# Patient Record
Sex: Female | Born: 1995 | Race: White | Hispanic: No | Marital: Single | Smoking: Never smoker
Health system: Southern US, Community
[De-identification: ages and names within clinical notes are randomized; demographics above are authoritative.]

## PROBLEM LIST (undated history)

## (undated) DIAGNOSIS — G43909 Migraine, unspecified, not intractable, without status migrainosus: Secondary | ICD-10-CM

---
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
## 2016-01-16 ENCOUNTER — Emergency Department: Payer: PRIVATE HEALTH INSURANCE

## 2016-01-16 ENCOUNTER — Emergency Department
Admission: EM | Admit: 2016-01-16 | Discharge: 2016-01-16 | Disposition: A | Discharge status: Home / Self Care | Payer: PRIVATE HEALTH INSURANCE | Attending: Emergency Medicine | Admitting: Emergency Medicine

## 2016-01-16 ENCOUNTER — Encounter: Payer: Self-pay | Admitting: Emergency Medicine

## 2016-01-16 DIAGNOSIS — Z3202 Encounter for pregnancy test, result negative: Secondary | ICD-10-CM | POA: Diagnosis not present

## 2016-01-16 DIAGNOSIS — R109 Unspecified abdominal pain: Secondary | ICD-10-CM

## 2016-01-16 DIAGNOSIS — R319 Hematuria, unspecified: Secondary | ICD-10-CM

## 2016-01-16 DIAGNOSIS — N390 Urinary tract infection, site not specified: Secondary | ICD-10-CM | POA: Insufficient documentation

## 2016-01-16 DIAGNOSIS — R112 Nausea with vomiting, unspecified: Secondary | ICD-10-CM | POA: Diagnosis not present

## 2016-01-16 DIAGNOSIS — R1031 Right lower quadrant pain: Secondary | ICD-10-CM | POA: Diagnosis present

## 2016-01-16 HISTORY — DX: Migraine, unspecified, not intractable, without status migrainosus: G43.909

## 2016-01-16 LAB — COMPREHENSIVE METABOLIC PANEL
ALBUMIN: 4.7 g/dL (ref 3.5–5.0)
ALK PHOS: 47 U/L (ref 38–126)
ALT: 14 U/L (ref 14–54)
ANION GAP: 11 (ref 5–15)
AST: 29 U/L (ref 15–41)
BILIRUBIN TOTAL: 0.8 mg/dL (ref 0.3–1.2)
BUN: 17 mg/dL (ref 6–20)
CALCIUM: 9.5 mg/dL (ref 8.9–10.3)
CO2: 23 mmol/L (ref 22–32)
Chloride: 107 mmol/L (ref 101–111)
Creatinine, Ser: 0.76 mg/dL (ref 0.44–1.00)
GFR calc Af Amer: >60 mL/min (ref >60)
GLUCOSE: 130 mg/dL — AB (ref 65–99)
Potassium: 4.1 mmol/L (ref 3.5–5.1)
Sodium: 141 mmol/L (ref 135–145)
TOTAL PROTEIN: 7.5 g/dL (ref 6.5–8.1)

## 2016-01-16 LAB — CBC
HCT: 43.9 % (ref 35.0–47.0)
Hemoglobin: 15.2 g/dL (ref 12.0–16.0)
MCH: 31.5 pg (ref 26.0–34.0)
MCHC: 34.7 g/dL (ref 32.0–36.0)
MCV: 90.7 fL (ref 80.0–100.0)
Platelets: 143 10*3/uL — ABNORMAL LOW (ref 150–440)
RBC: 4.84 MIL/uL (ref 3.80–5.20)
RDW: 13.5 % (ref 11.5–14.5)
WBC: 8 10*3/uL (ref 3.6–11.0)

## 2016-01-16 LAB — URINALYSIS COMPLETE WITH MICROSCOPIC (ARMC ONLY)
BILIRUBIN URINE: NEGATIVE
GLUCOSE, UA: NEGATIVE mg/dL
HGB URINE DIPSTICK: NEGATIVE
NITRITE: NEGATIVE
Protein, ur: NEGATIVE mg/dL
SPECIFIC GRAVITY, URINE: 1.023 (ref 1.005–1.030)
pH: 7 (ref 5.0–8.0)

## 2016-01-16 LAB — LIPASE, BLOOD: Lipase: 23 U/L (ref 11–51)

## 2016-01-16 LAB — PREGNANCY, URINE: Preg Test, Ur: NEGATIVE

## 2016-01-16 MED ORDER — IOHEXOL 300 MG/ML  SOLN
75.0000 mL | Freq: Once | INTRAMUSCULAR | Status: AC | PRN
  Administered 2016-01-16: 75 mL via INTRAVENOUS

## 2016-01-16 MED ORDER — IOHEXOL 240 MG/ML SOLN
25.0000 mL | Freq: Once | INTRAMUSCULAR | Status: AC | PRN
  Administered 2016-01-16: 25 mL via ORAL

## 2016-01-16 MED ORDER — ONDANSETRON 4 MG PO TBDP: 4.0000 mg | ORAL_TABLET | Freq: Three times a day (TID) | ORAL | Status: AC | PRN

## 2016-01-16 MED ORDER — SULFAMETHOXAZOLE-TRIMETHOPRIM 800-160 MG PO TABS: 1.0000 | ORAL_TABLET | Freq: Two times a day (BID) | ORAL | Status: AC

## 2016-01-16 MED ORDER — ONDANSETRON HCL 4 MG/2ML IJ SOLN
4.0000 mg | Freq: Once | INTRAMUSCULAR | Status: AC | PRN
  Administered 2016-01-16: 4 mg via INTRAVENOUS
  Filled 2016-01-16: qty 2

## 2016-01-16 MED ORDER — SODIUM CHLORIDE 0.9 % IV BOLUS (SEPSIS)
1000.0000 mL | Freq: Once | INTRAVENOUS | Status: AC
Start: 2016-01-16 — End: 2016-01-16
  Administered 2016-01-16: 1000 mL via INTRAVENOUS

## 2016-01-16 NOTE — ED Notes (Signed)
Report from Ann, RN

## 2016-01-16 NOTE — ED Provider Notes (Signed)
[facility] Emergency Department Provider Note  ____________________________________________  Time seen: Approximately 8:48 AM  I have reviewed the triage vital signs and the nursing notes.   HISTORY  Chief Complaint Abdominal Pain and Emesis    HPI Renee Velazquez is a 20 y.o. female who complains of nausea and vomiting starting yesterday. Patient also had some mild abdominal pain especially in the right lower quadrant. She has not had any diarrhea she denies any fever. Pain seemed to get worse if she ate. Not really made worse by movement. She last vomited several hours ago. Pain is achy in quality  Past Medical History  Diagnosis Date  . Migraines     There are no active problems to display for this patient.   History reviewed. No pertinent past surgical history.  Current Outpatient Rx  Name  Route  Sig  Dispense  Refill  . ibuprofen (ADVIL,MOTRIN) 200 MG tablet   Oral   Take 400 mg by mouth every 4 (four) hours as needed for mild pain or moderate pain.         . SUMAtriptan (IMITREX) 50 MG tablet   Oral   Take 50 mg by mouth every 2 (two) hours as needed for migraine. May repeat in 2 hours if headache persists or recurs.         . ondansetron (ZOFRAN ODT) 4 MG disintegrating tablet   Oral   Take 1 tablet (4 mg total) by mouth every 8 (eight) hours as needed for nausea or vomiting.   20 tablet   0   . sulfamethoxazole-trimethoprim (BACTRIM DS,SEPTRA DS) 800-160 MG tablet   Oral   Take 1 tablet by mouth 2 (two) times daily.   14 tablet   0     Allergies Review of patient's allergies indicates no known allergies.  History reviewed. No pertinent family history.  Social History Social History  Substance Use Topics  . Smoking status: Never Smoker   . Smokeless tobacco: None  . Alcohol Use: Yes     Comment: occasional    Review of Systems Constitutional: No fever/chills Eyes: No visual changes. ENT: No sore  throat. Cardiovascular: Denies chest pain. Respiratory: Denies shortness of breath. Gastrointestinal: No abdominal pain.  No nausea, no vomiting.  No diarrhea.  No constipation. Genitourinary: Negative for dysuria. Musculoskeletal: Negative for back pain. Skin: Negative for rash. Neurological: Negative for headaches, focal weakness or numbness.   10-point ROS otherwise negative.  ____________________________________________   PHYSICAL EXAM:  VITAL SIGNS: ED Triage Vitals  Enc Vitals Group     BP 01/16/16 0633 117/68 mmHg     Pulse Rate 01/16/16 0633 103     Resp 01/16/16 0633 18     Temp 01/16/16 0633 98.1 F (36.7 C)     Temp Source 01/16/16 0633 Oral     SpO2 01/16/16 0633 100 %     Weight 01/16/16 0633 147 lb 11.3 oz (67 kg)     Height 01/16/16 0633 5\' 8"  (1.727 m)     Head Cir --      Peak Flow --      Pain Score 01/16/16 0630 3     Pain Loc --      Pain Edu? --      Excl. in GC? --     Constitutional: Alert and oriented. Well appearing and in no acute distress. Eyes: Conjunctivae are normal. PERRL. EOMI. Head: Atraumatic. Nose: No congestion/rhinnorhea. Mouth/Throat: Mucous membranes are moist.  Oropharynx non-erythematous. Neck:  No stridor.  Cardiovascular: Normal rate, regular rhythm. Grossly normal heart sounds.  Good peripheral circulation. Respiratory: Normal respiratory effort.  No retractions. Lungs CTAB. Gastrointestinal: Soft mildly tender on the right side especially right lower quadrant to palpation as well as percussion. There is no suprapubic tenderness whatsoever.Bowel sounds are slightly decreased No distention. No abdominal bruits. No CVA tenderness. Musculoskeletal: No lower extremity tenderness nor edema.  No joint effusions. Neurologic:  Normal speech and language. No gross focal neurologic deficits are appreciated. No gait instability. Skin:  Skin is warm, dry and intact. No rash noted. Psychiatric: Mood and affect are normal. Speech and  behavior are normal.  ____________________________________________   LABS (all labs ordered are listed, but only abnormal results are displayed)  Labs Reviewed  COMPREHENSIVE METABOLIC PANEL - Abnormal; Notable for the following:    Glucose, Bld 130 (*)    All other components within normal limits  CBC - Abnormal; Notable for the following:    Platelets 143 (*)    All other components within normal limits  URINALYSIS COMPLETEWITH MICROSCOPIC ([facility] ONLY) - Abnormal; Notable for the following:    Color, Urine YELLOW (*)    APPearance HAZY (*)    Ketones, ur 2+ (*)    Leukocytes, UA 2+ (*)    Bacteria, UA RARE (*)    Squamous Epithelial / LPF 6-30 (*)    All other components within normal limits  LIPASE, BLOOD  PREGNANCY, URINE   ____________________________________________  EKG   ____________________________________________  RADIOLOGY  CT read by radiology as normal ____________________________________________   PROCEDURES    ____________________________________________   INITIAL IMPRESSION / ASSESSMENT AND PLAN / ED COURSE  Pertinent labs & imaging results that were available during my care of the patient were reviewed by me and considered in my medical decision making (see chart for details).   ____________________________________________   FINAL CLINICAL IMPRESSION(S) / ED DIAGNOSES  Final diagnoses:  Nausea and vomiting, vomiting of unspecified type  Urinary tract infection with hematuria, site unspecified  Abdominal pain, unspecified abdominal location      Arnaldo Natal, MD 01/16/16 1646

## 2016-01-16 NOTE — ED Notes (Signed)
Pt says she began having abd pain around 930 last night; vomiting began around 1145; unable to keep anything down; denies diarrhea; generalized abd pain, at times worse to right lower quadrant;

## 2016-01-16 NOTE — ED Notes (Signed)
Pt finished with contrast, CT informed.

## 2016-01-16 NOTE — ED Notes (Signed)
Pt alert and oriented X4, active, cooperative, pt in NAD. RR even and unlabored, color WNL.  Pt informed to return if any life threatening symptoms occur.   

## 2016-01-16 NOTE — Discharge Instructions (Signed)

## 2016-01-16 NOTE — ED Notes (Signed)
MD at bedside. 

## 2016-08-24 ENCOUNTER — Ambulatory Visit (INDEPENDENT_AMBULATORY_CARE_PROVIDER_SITE_OTHER): Payer: PRIVATE HEALTH INSURANCE | Admitting: Family Medicine

## 2016-08-24 ENCOUNTER — Encounter: Payer: Self-pay | Admitting: Family Medicine

## 2016-08-24 DIAGNOSIS — M542 Cervicalgia: Secondary | ICD-10-CM

## 2016-08-24 MED ORDER — CYCLOBENZAPRINE HCL 5 MG PO TABS: 5.0000 mg | ORAL_TABLET | Freq: Every day | ORAL | 0 refills | Status: AC

## 2016-08-24 MED ORDER — NAPROXEN 500 MG PO TABS: 500.0000 mg | ORAL_TABLET | Freq: Two times a day (BID) | ORAL | 0 refills | Status: AC

## 2016-08-24 NOTE — Progress Notes (Signed)
Patient presents today with symptoms of left upper neck/trap tenderness and tightness. Patient states that she sometimes feels numbness in her left upper extremity. Most of her symptoms are when she is seated at the computer during class. She also has some symptoms with raising of her left arm. She states she's had the symptoms for the last few months. She denies any history of any trauma or injury to the neck. She denies feeling the arm being cold or discolored. It is sometimes uncomfortable to sleep. She has been working with the trainer on scapular stabilization. She states she does not feel it is much when she is doing cheerleading activities.  ROS: Negative except mentioned above.  GENERAL: NAD RESP: CTA B CARD: RRR MSK: No midline cervical tenderness, mild left paravertebral tenderness in the mid to lower cervical spine, full range of motion of the neck, mild tenderness and spasm in the upper left trap, full range of motion of the upper extremities with 5 out of 5 strength of the upper extremities, -Spurlings, -Adson's Test, +Roos Test (numbness), NV intact NEURO: CN II-XII grossly intact   A/P: Left cervical neck pain with radicular symptoms -will initially do C-spine x-rays and MRI C-spine, also look for cervical rib, will treat with Naprosyn and Flexeril for now, avoid activities that cause symptoms to worsen, will discuss with trainer.

## 2016-09-04 ENCOUNTER — Ambulatory Visit
Admission: RE | Admit: 2016-09-04 | Discharge: 2016-09-04 | Disposition: A | Discharge status: Home / Self Care | Payer: PRIVATE HEALTH INSURANCE | Source: Ambulatory Visit | Attending: Family Medicine | Admitting: Family Medicine

## 2016-09-04 DIAGNOSIS — M542 Cervicalgia: Secondary | ICD-10-CM | POA: Insufficient documentation

## 2016-09-12 ENCOUNTER — Other Ambulatory Visit: Payer: Self-pay | Admitting: Family Medicine

## 2016-09-12 DIAGNOSIS — M5412 Radiculopathy, cervical region: Secondary | ICD-10-CM

## 2016-09-16 ENCOUNTER — Ambulatory Visit: Admission: RE | Admit: 2016-09-16 | Payer: PRIVATE HEALTH INSURANCE | Source: Ambulatory Visit

## 2016-09-27 ENCOUNTER — Ambulatory Visit
Admission: RE | Admit: 2016-09-27 | Discharge: 2016-09-27 | Disposition: A | Discharge status: Home / Self Care | Payer: PRIVATE HEALTH INSURANCE | Source: Ambulatory Visit | Attending: Family Medicine | Admitting: Family Medicine

## 2016-09-27 DIAGNOSIS — M5412 Radiculopathy, cervical region: Secondary | ICD-10-CM | POA: Insufficient documentation

## 2016-09-29 ENCOUNTER — Other Ambulatory Visit: Payer: Self-pay | Admitting: Family Medicine

## 2016-09-29 ENCOUNTER — Ambulatory Visit (INDEPENDENT_AMBULATORY_CARE_PROVIDER_SITE_OTHER): Payer: PRIVATE HEALTH INSURANCE | Admitting: Family Medicine

## 2016-09-29 DIAGNOSIS — Q048 Other specified congenital malformations of brain: Secondary | ICD-10-CM

## 2016-09-29 DIAGNOSIS — M542 Cervicalgia: Secondary | ICD-10-CM

## 2016-09-29 NOTE — Progress Notes (Signed)
Patient presents today for follow-up regarding recent MRI of cervical spine. Patient has been having symptoms of left-sided neck pain extending into the left arm mostly in the left shoulder for the last few months. She admits to having a history of migraines. She denies any headache at this time. She denies any recent head injuries. MRI of the C-spine did not show any discogenic issues. 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 Patient states that she has never had an MRI or CT scan done of her head or neck before. She states that her migraines are manageable and occur every few months. She denies any vision problems or any balance issues. 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  ROS: Negative except mentioned above.  GENERAL: NAD RESP: CTA B CARD: RRR NEURO: CN II-XII grossly intact, -Rombergs  A/P: Left-sided neck pain with radicular symptoms, migraines, cerebellar ectopia -will send patient to neurology for further workup. I would not think the cerebellar ectopia would be causing her symptoms but will confirm with neurology. We'll also seen neurology to make sure there are no risks associated with this finding and her cheerleading. Patient states that despite the results that I have discussed with her she would want to continue cheerleading. We'll seek medical attention if any acute problems.

## 2017-07-16 IMAGING — CT CT ABD-PELV W/ CM
1 of 4 series · 13 of 32 positions shown, 19 images · IV contrast (omnipaque)
Comparison: None.

CLINICAL DATA: Right lower quadrant pain and vomiting, 8 hours
duration.

EXAM:
CT ABDOMEN AND PELVIS WITH CONTRAST
TECHNIQUE: Multidetector CT imaging of the abdomen and pelvis was performed
using the standard protocol following bolus administration of
intravenous contrast.
CONTRAST:  75mL OMNIPAQUE IOHEXOL 300 MG/ML  SOLN

[Series 2: routine abd pel with · axial · 0.68mm/px · z∈[-402,-12]mm · 13 of 92 slices shown, 19 images]
[im 7/92  soft-tissue]
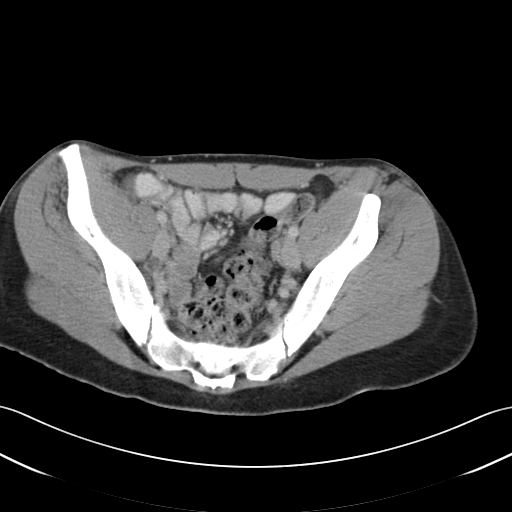
[im 7/92  bone]
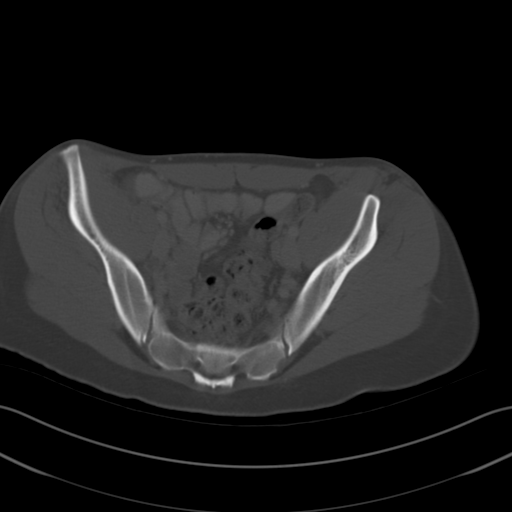
[im 14/92  soft-tissue]
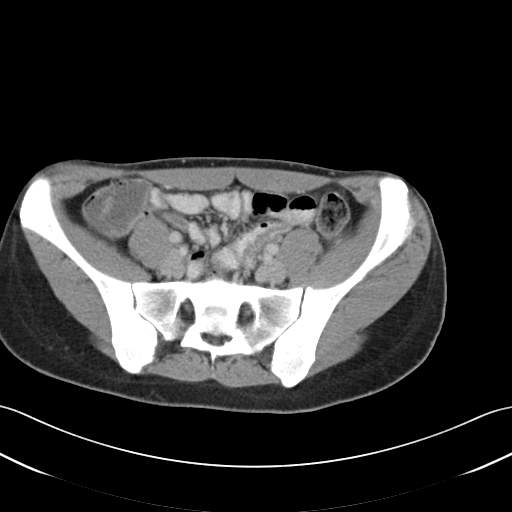
[im 20/92  soft-tissue]
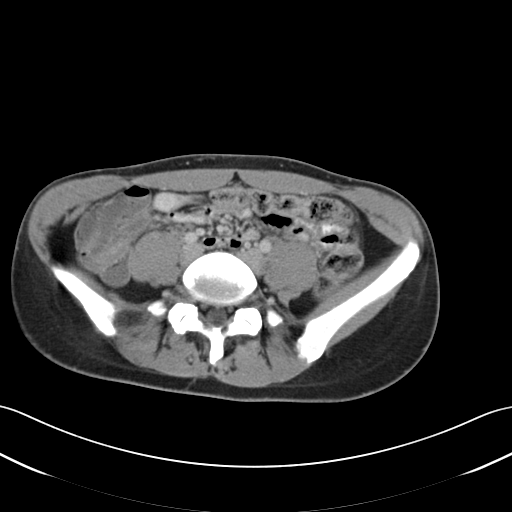
[im 27/92  soft-tissue]
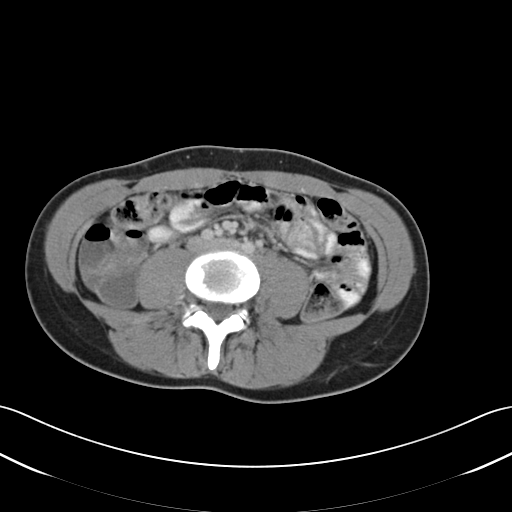
[im 33/92  soft-tissue]
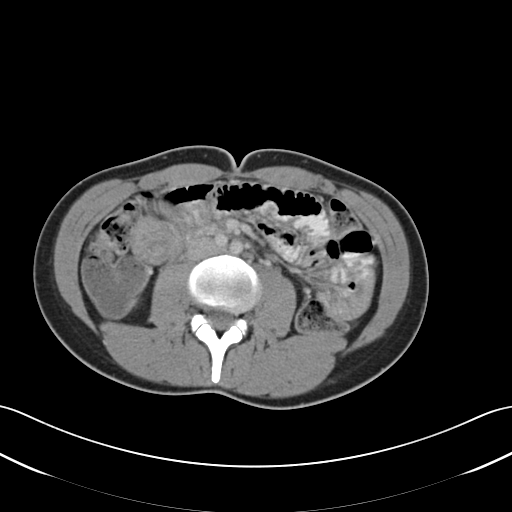
[im 40/92  soft-tissue]
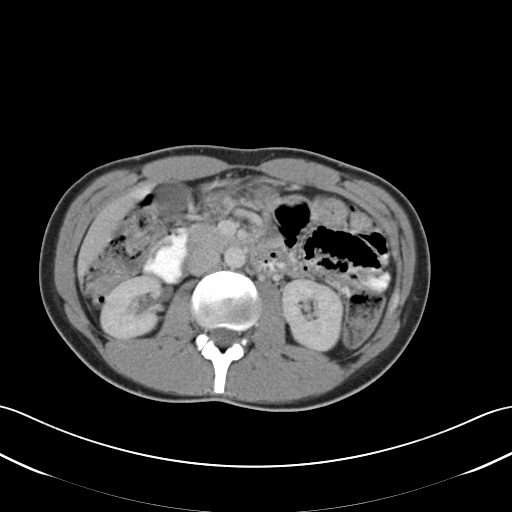
[im 46/92  soft-tissue]
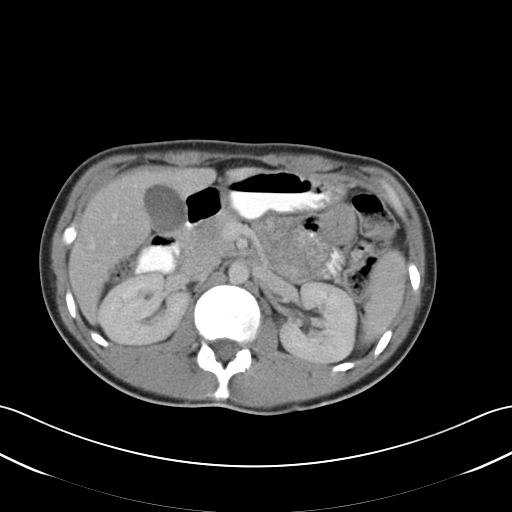
[im 53/92  soft-tissue]
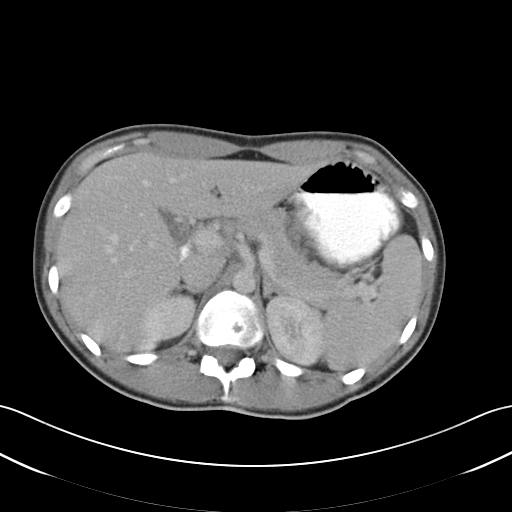
[im 59/92  soft-tissue]
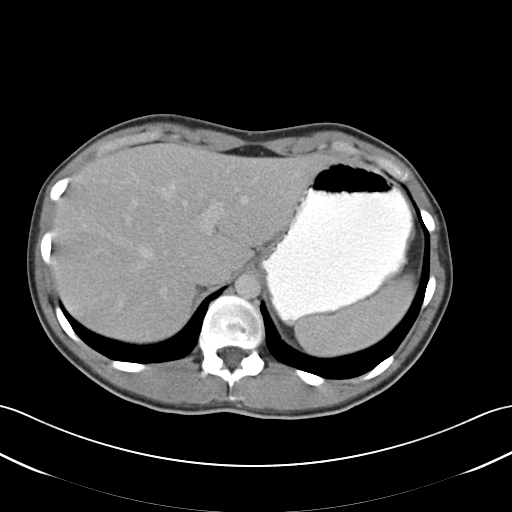
[im 59/92  bone]
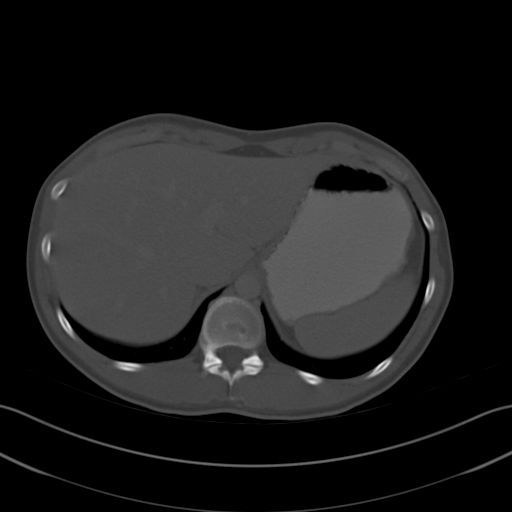
[im 66/92  soft-tissue]
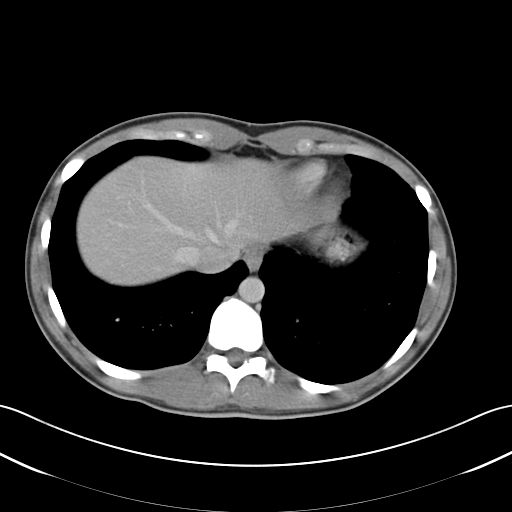
[im 66/92  lung]
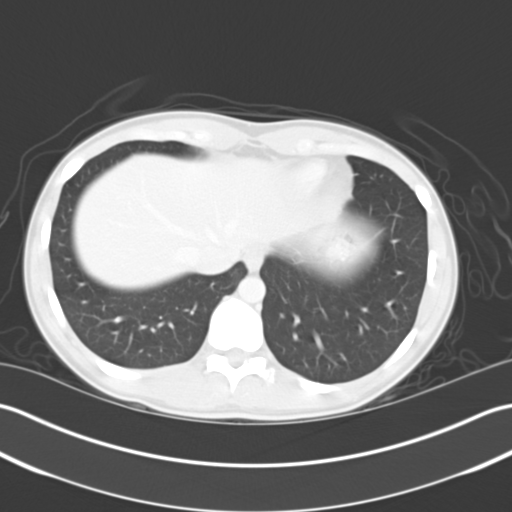
[im 72/92  soft-tissue]
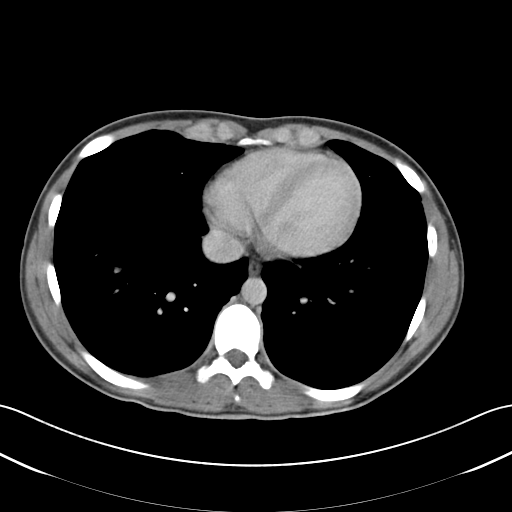
[im 72/92  lung]
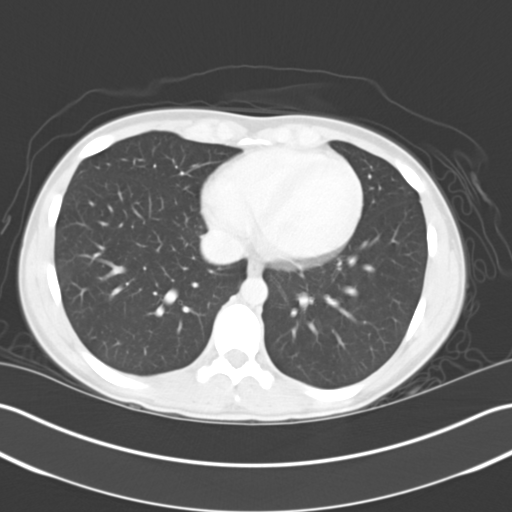
[im 79/92  soft-tissue]
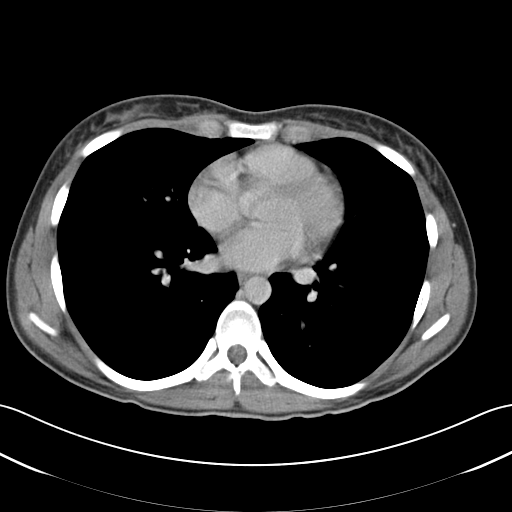
[im 79/92  lung]
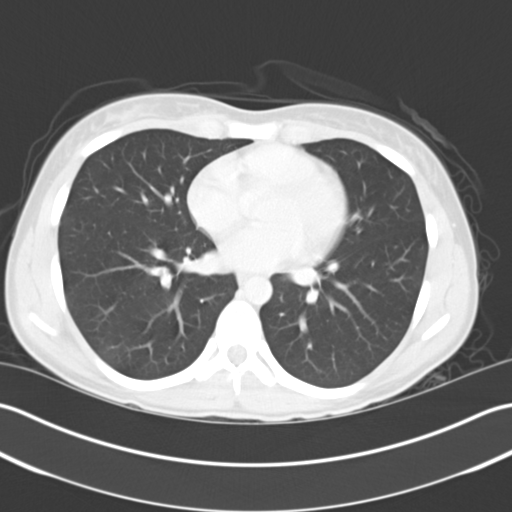
[im 85/92  soft-tissue]
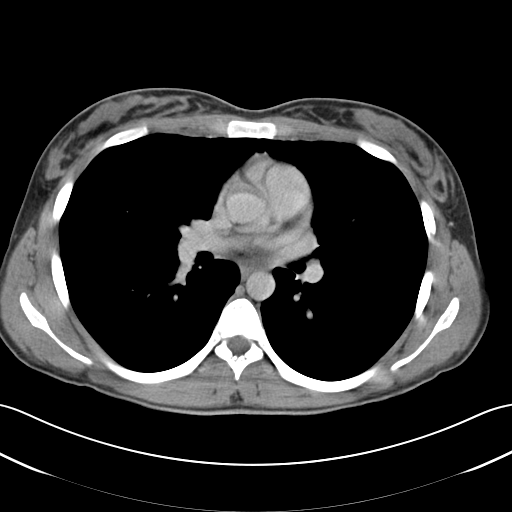
[im 85/92  lung]
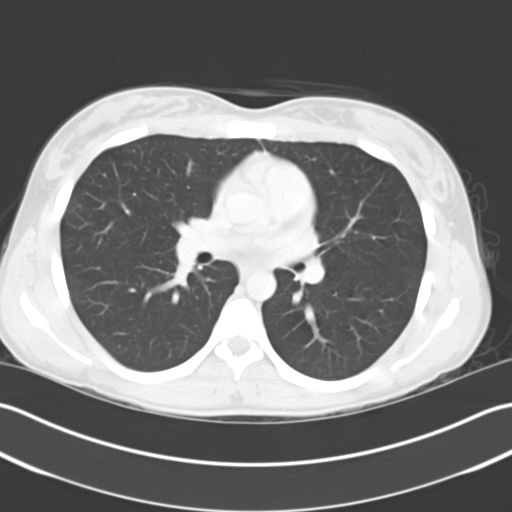

[13 of 32 positions shown; findings below may reference images not displayed]

FINDINGS: Lower chest:  Normal

Hepatobiliary: Normal

Pancreas: Normal

Spleen: Normal

Adrenals/Urinary Tract: Normal

Stomach/Bowel: No evidence of ileus, obstruction, free fluid or free
air. The appendix is located at the cecal tip and appears normal.
Fluid filled terminal ileum simulates the appendix but is normal
small bowel. No abnormality seen to explain right lower quadrant
pain.

Vascular/Lymphatic: Normal

Other: Uterus and adnexal regions are normal. No free fluid in the
pelvis.

Musculoskeletal: No acute or significant finding. Vertebral endplate
Schmorl's nodes which are chronic and not significant in this
presentation.
IMPRESSION: Normal examination. No cause of right lower quadrant pain
identified. No CT evidence of appendicitis.

## 2018-03-05 IMAGING — CR DG CERVICAL SPINE COMPLETE 4+V
1 series · 6 of 6 positions shown · non-contrast
Comparison: None.

CLINICAL DATA: Left-sided neck pain for several months and left arm
numbness without known injury.

EXAM:
CERVICAL SPINE - COMPLETE 4+ VIEW

[Series 1: dg cervical spine complete · 0.14mm/px · 6 of 6 slices shown]
[im 1/6]
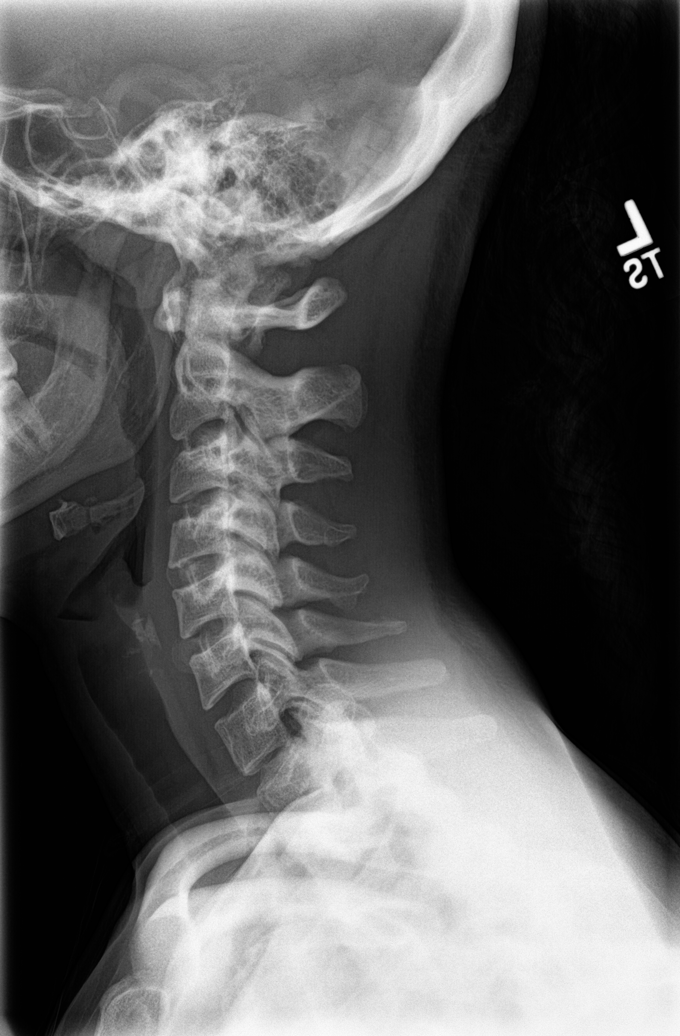
[im 2/6]
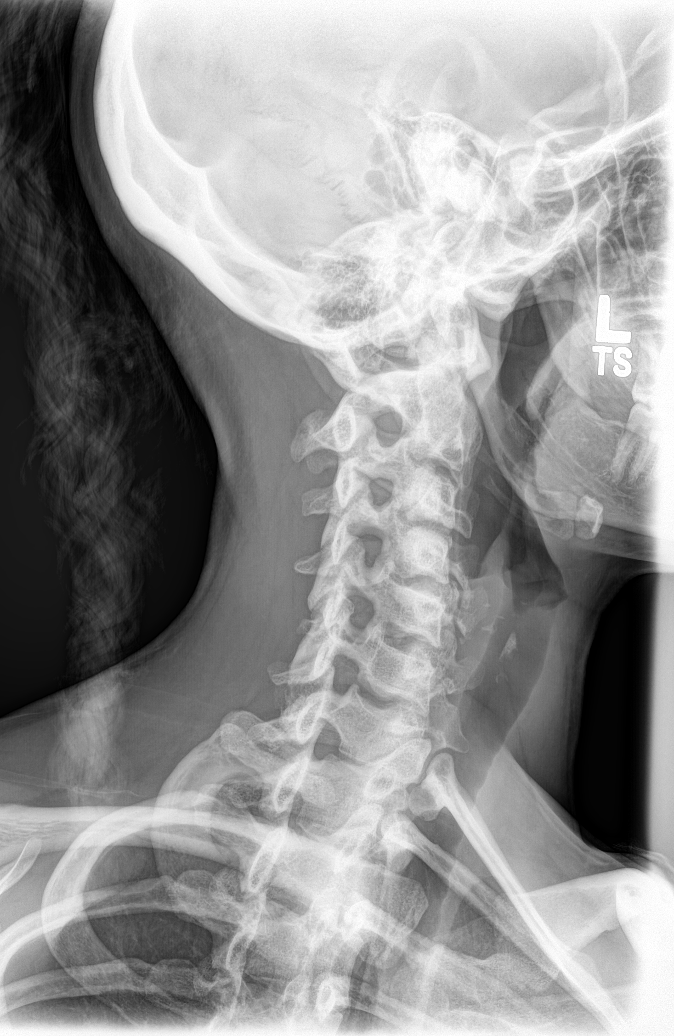
[im 3/6]
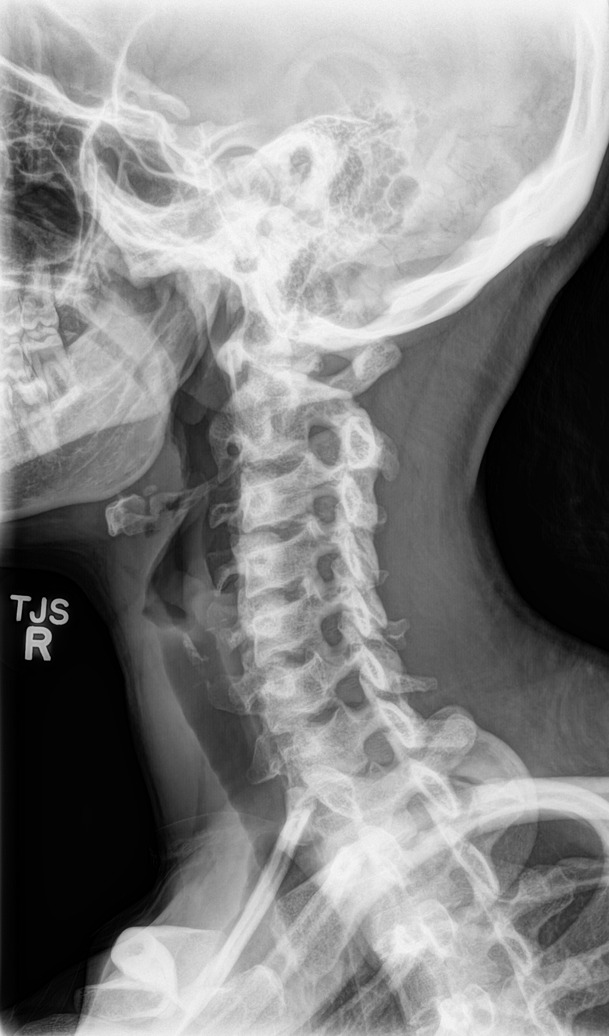
[im 4/6]
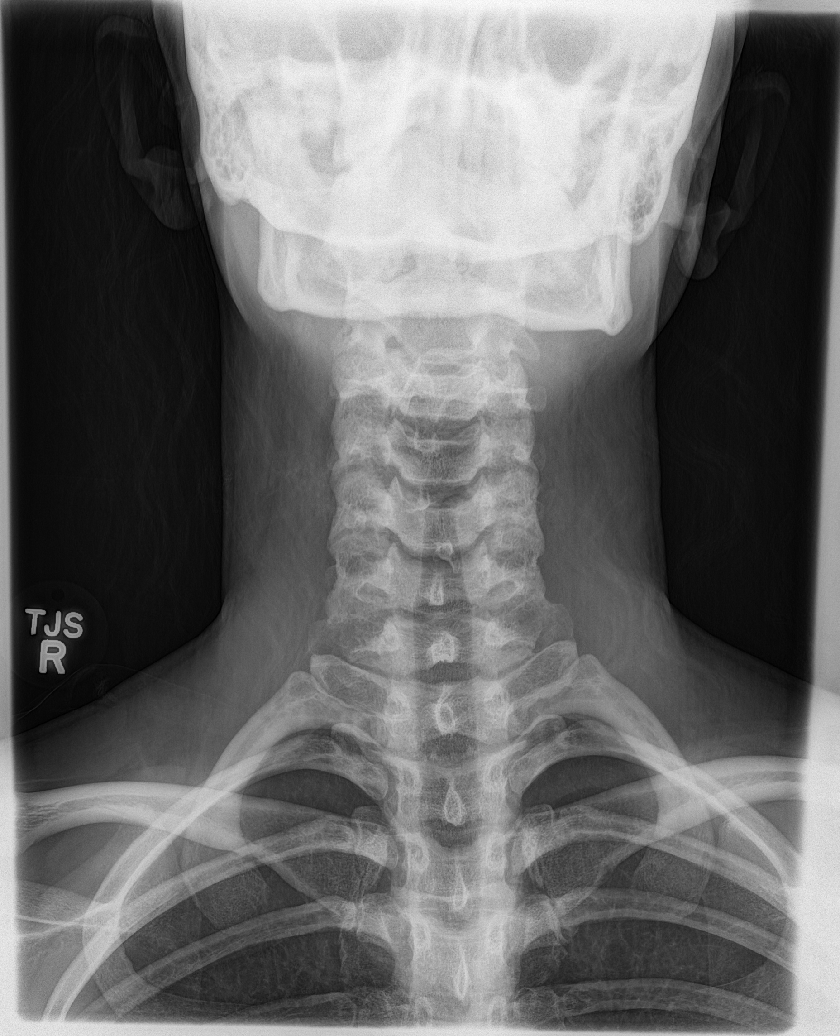
[im 5/6]
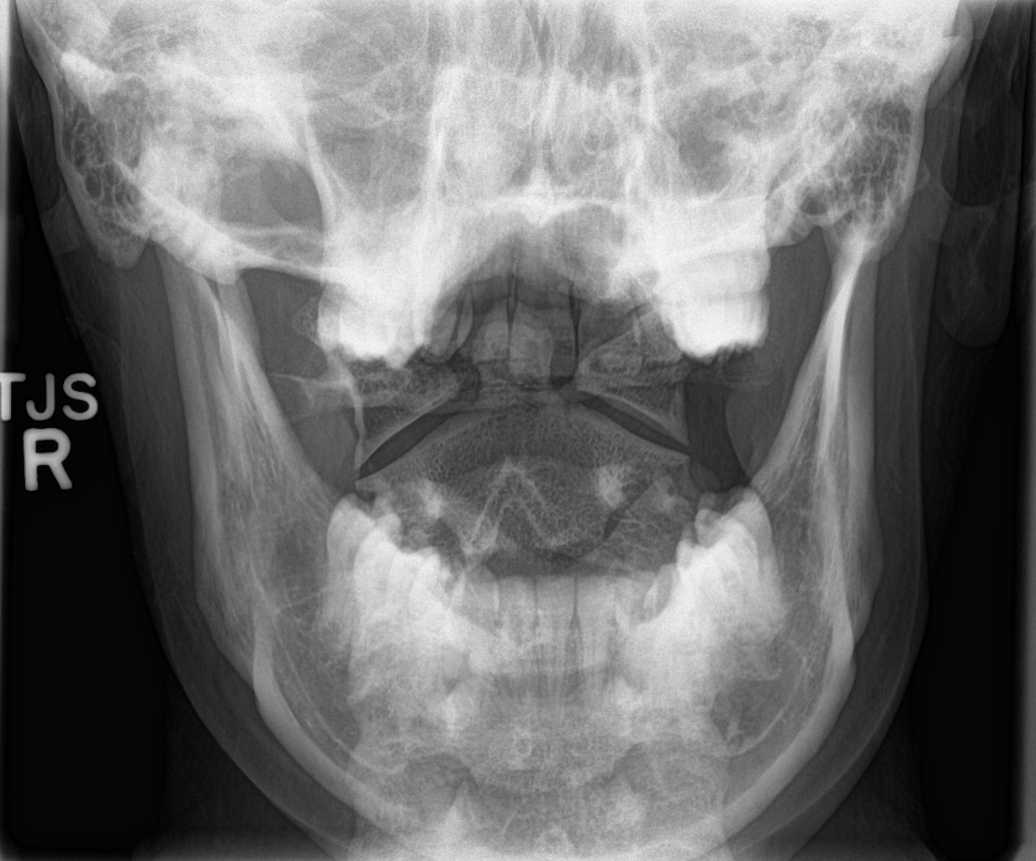
[im 6/6]
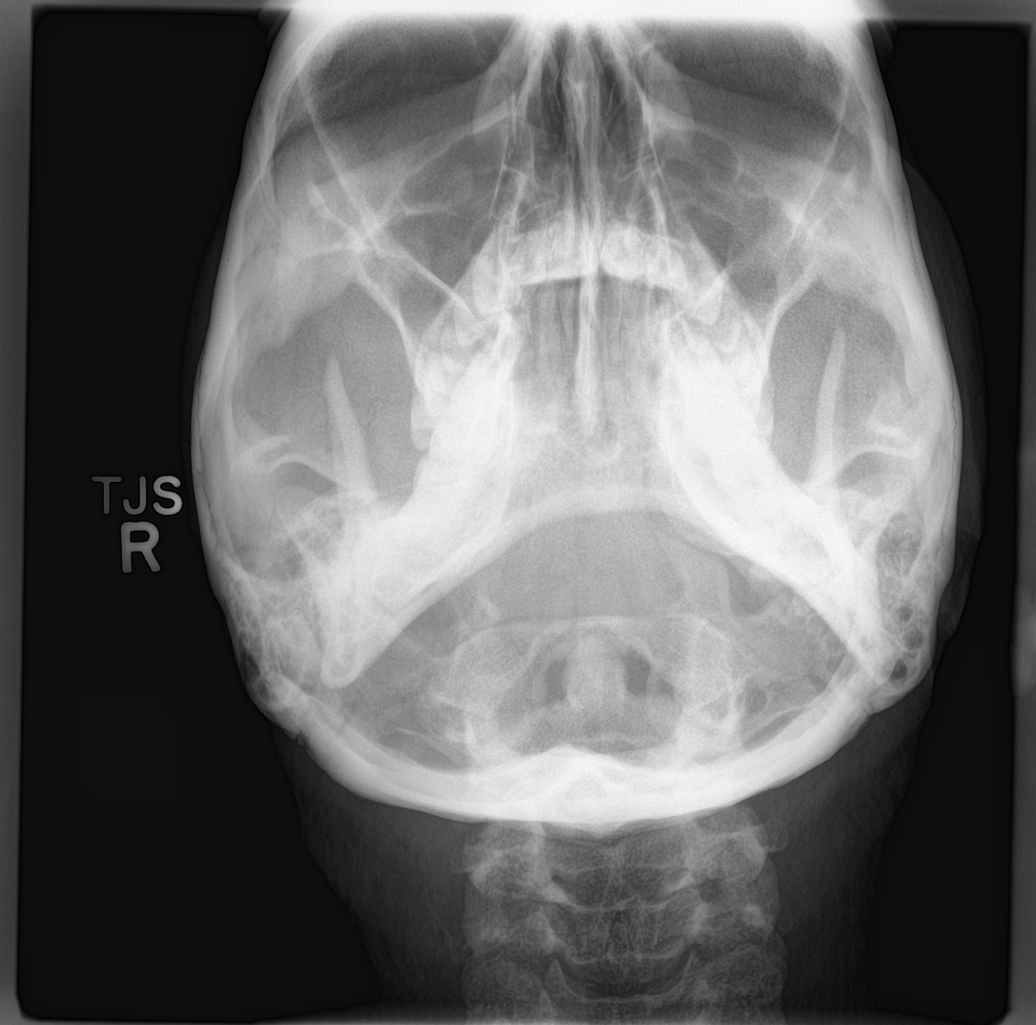

[6 of 6 positions shown; findings below may reference images not displayed]

FINDINGS: There is no evidence of cervical spine fracture or prevertebral soft
tissue swelling. Alignment is normal. No other significant bone
abnormalities are identified.
IMPRESSION: Negative cervical spine radiographs.
